# Patient Record
Sex: Female | Born: 1940 | Hispanic: No | Marital: Single | Smoking: Never smoker
Health system: Southern US, Community
[De-identification: ages and names within clinical notes are randomized; demographics above are authoritative.]

## PROBLEM LIST (undated history)

## (undated) DIAGNOSIS — E119 Type 2 diabetes mellitus without complications: Secondary | ICD-10-CM

## (undated) DIAGNOSIS — I10 Essential (primary) hypertension: Secondary | ICD-10-CM

## (undated) HISTORY — PX: APPENDECTOMY: SHX54

---

## 2020-07-13 ENCOUNTER — Other Ambulatory Visit: Payer: Self-pay

## 2020-07-13 ENCOUNTER — Ambulatory Visit (INDEPENDENT_AMBULATORY_CARE_PROVIDER_SITE_OTHER): Payer: Self-pay

## 2020-07-13 ENCOUNTER — Ambulatory Visit (HOSPITAL_COMMUNITY)
Admission: EM | Admit: 2020-07-13 | Discharge: 2020-07-13 | Disposition: A | Discharge status: Home / Self Care | Payer: Self-pay

## 2020-07-13 ENCOUNTER — Encounter (HOSPITAL_COMMUNITY): Payer: Self-pay

## 2020-07-13 DIAGNOSIS — S64493A Injury of digital nerve of left middle finger, initial encounter: Secondary | ICD-10-CM

## 2020-07-13 DIAGNOSIS — M79645 Pain in left finger(s): Secondary | ICD-10-CM

## 2020-07-13 DIAGNOSIS — S61213A Laceration without foreign body of left middle finger without damage to nail, initial encounter: Secondary | ICD-10-CM

## 2020-07-13 HISTORY — DX: Essential (primary) hypertension: I10

## 2020-07-13 HISTORY — DX: Type 2 diabetes mellitus without complications: E11.9

## 2020-07-13 MED ORDER — TETANUS-DIPHTH-ACELL PERTUSSIS 5-2.5-18.5 LF-MCG/0.5 IM SUSY
0.5000 mL | PREFILLED_SYRINGE | Freq: Once | INTRAMUSCULAR | Status: AC
  Administered 2020-07-13: 0.5 mL via INTRAMUSCULAR

## 2020-07-13 MED ORDER — DOXYCYCLINE HYCLATE 100 MG PO CAPS: 100.0000 mg | ORAL_CAPSULE | Freq: Two times a day (BID) | ORAL | 0 refills | Status: AC

## 2020-07-13 MED ORDER — BACITRACIN ZINC 500 UNIT/GM EX OINT
TOPICAL_OINTMENT | CUTANEOUS | Status: AC
  Filled 2020-07-13: qty 0.9

## 2020-07-13 MED ORDER — TETANUS-DIPHTH-ACELL PERTUSSIS 5-2.5-18.5 LF-MCG/0.5 IM SUSY
PREFILLED_SYRINGE | INTRAMUSCULAR | Status: AC
  Filled 2020-07-13: qty 0.5

## 2020-07-13 NOTE — ED Provider Notes (Addendum)
[facility]    CSN: [id number] Arrival date & time: 07/13/20  1615      History   Chief Complaint Chief Complaint  Patient presents with  . Finger Injury    HPI Rebecca Hutchinson is a 80 y.o. female. With her grand daughter, her daughter via phone and Thyra Breed our medical interpretor.   HPI  Finger Injury: Pt reports that last night she accidentally got her left middle finger caught in a door window. This caused immediate severe pain and bleeding which resolved within 5 minutes. Since then she has had a constant burning like sensation of this finger. They have cleaned and wrapped the wound. She denied fever, stiffness of the finger or discharge.    Past Medical History:  Diagnosis Date  . Diabetes mellitus without complication (HCC)   . Hypertension     There are no problems to display for this patient.   History reviewed. No pertinent surgical history.  OB History   No obstetric history on file.      Home Medications    Prior to Admission medications   Medication Sig Start Date End Date Taking? Authorizing Provider  docusate sodium (COLACE) 100 MG capsule Take 100 mg by mouth 2 (two) times daily.   Yes [provider]  omeprazole (PRILOSEC) 40 MG capsule Take 40 mg by mouth daily.   Yes [provider]    Family History History reviewed. No pertinent family history.  Social History Social History   Tobacco Use  . Smoking status: Never Smoker  . Smokeless tobacco: Never Used     Allergies   Patient has no known allergies.   Review of Systems Review of Systems  As stated above in HPI Physical Exam Triage Vital Signs ED Triage Vitals [07/13/20 1656]  Enc Vitals Group     BP (!) 149/77     Pulse Rate (!) 59     Resp 17     Temp 98.6 F (37 C)     Temp Source Oral     SpO2 100 %     Weight      Height      Head Circumference      Peak Flow      Pain Score      Pain Loc      Pain Edu?      Excl. in GC?    No  data found.  Updated Vital Signs BP (!) 149/77   Pulse (!) 59   Temp 98.6 F (37 C) (Oral)   Resp 17   SpO2 100%   Physical Exam Vitals and nursing note reviewed.  Cardiovascular:     Pulses: Normal pulses.  Musculoskeletal:        General: Swelling, tenderness and signs of injury present. Normal range of motion.  Skin:    Capillary Refill: Capillary refill takes less than 2 seconds.     Comments: 1cm superficial laceration of the medial left distal middle finger without erythema, discharge or bleeding   Neurological:     General: No focal deficit present.      UC Treatments / Results  Labs (all labs ordered are listed, but only abnormal results are displayed) Labs Reviewed - No data to display  EKG   Radiology No results found.  Procedures Procedures (including critical care time)  Medications Ordered in UC Medications - No data to display  Initial Impression / Assessment and Plan / UC Course  I have reviewed the  triage vital signs and the nursing notes.  Pertinent labs & imaging results that were available during my care of the patient were reviewed by me and considered in my medical decision making (see chart for details).     New. No fracture seen on x ray. Treating with wound care, tylenol for pain, Tdap as she is not sure when her last was and doxycyline given her history of DM2 and finger laceration to prevent infection. Discussed how to use these medications along with common potential side effects and precautions.  Final Clinical Impressions(s) / UC Diagnoses   Final diagnoses:  None   Discharge Instructions   None    ED Prescriptions    None     PDMP not reviewed this encounter.   Rushie Chestnut, PA-C 07/13/20 1829    Rushie Chestnut, PA-C 07/13/20 1829

## 2020-07-13 NOTE — ED Triage Notes (Signed)
Pt in with c/o left middle finger injury that occurred last night after she closed her finger in a door  Pt states she feels a burning sensation in her finger

## 2022-01-24 ENCOUNTER — Encounter (HOSPITAL_COMMUNITY): Payer: Self-pay | Admitting: Emergency Medicine

## 2022-01-24 ENCOUNTER — Ambulatory Visit (HOSPITAL_COMMUNITY)
Admission: EM | Admit: 2022-01-24 | Discharge: 2022-01-24 | Disposition: A | Discharge status: Home / Self Care | Payer: Self-pay | Attending: Family Medicine | Admitting: Family Medicine

## 2022-01-24 DIAGNOSIS — N898 Other specified noninflammatory disorders of vagina: Secondary | ICD-10-CM

## 2022-01-24 DIAGNOSIS — N3000 Acute cystitis without hematuria: Secondary | ICD-10-CM

## 2022-01-24 LAB — POCT URINALYSIS DIPSTICK, ED / UC
Bilirubin Urine: NEGATIVE
Glucose, UA: NEGATIVE mg/dL
Ketones, ur: NEGATIVE mg/dL
Nitrite: POSITIVE — AB
Protein, ur: 30 mg/dL — AB
Specific Gravity, Urine: 1.015 (ref 1.005–1.030)
Urobilinogen, UA: 0.2 mg/dL (ref 0.0–1.0)
pH: 5.5 (ref 5.0–8.0)

## 2022-01-24 LAB — CBG MONITORING, ED: Glucose-Capillary: 147 mg/dL — ABNORMAL HIGH (ref 70–99)

## 2022-01-24 MED ORDER — FLUCONAZOLE 150 MG PO TABS: 150.0000 mg | ORAL_TABLET | ORAL | 0 refills | Status: AC

## 2022-01-24 MED ORDER — CEPHALEXIN 250 MG/5ML PO SUSR: 250.0000 mg | Freq: Two times a day (BID) | ORAL | 0 refills | Status: AC

## 2022-01-24 MED ORDER — CLOTRIMAZOLE 1 % EX CREA: TOPICAL_CREAM | CUTANEOUS | 0 refills | Status: AC

## 2022-01-24 NOTE — ED Triage Notes (Signed)
Pt having vaginal itching for about week. Pt was scratching so much last night caused to bleed per family member.

## 2022-01-24 NOTE — ED Provider Notes (Signed)
[facility]    CSN: [id number] Arrival date & time: 01/24/22  1220      History   Chief Complaint Chief Complaint  Patient presents with   Vaginal Itching    HPI Rebecca Hutchinson is a 81 y.o. female.   Patient presents urgent care with her daughter-in-law for evaluation of vaginal itching for the last week.  Daughter-in-law states that patient has continued to itch the vaginal area to the point where she is bleeding intermittently from the vaginal tissue due to significant itch.  No recent change in personal hygiene products, laundry detergents, or medications.  Patient is a diabetic and daughter-in-law checks her blood sugar nightly then administers a sliding scale amount of insulin depending on blood sugar.  Lately, blood sugars have been in the 140s, but last night blood sugar was 248.  Patient denies changes in mental status, confusion, blurry vision, shortness of breath, abdominal pain, vaginal discharge, and abnormal uterine bleeding.  Complains of dysuria both during urination and pain after urination likely due to rash to the vaginal area.  Reports slight urinary frequency and urgency over the last couple of days as well.  Denies recent antibiotic or steroid use.  Patient has been visiting the [place] from [place] for the last 6 months and returns to [place] 4 days from now.  Daughter in law states that whenever patient comes to the [place] initially, her sugars are very out-of-control but they are able to get them down and into better control while she is here.  Patient frequently asks for Coca-Cola and sweets but daughter encourages her to drink lots of water.  No known medical history, although daughter-in-law states she was diagnosed with kidney problems when she received a scan of unknown type in [place] last year.  A language interpreter was used (Spanish medical interpreter used.).  Vaginal Itching    Past Medical History:  Diagnosis Date   Diabetes mellitus without  complication (HCC)    Hypertension     There are no problems to display for this patient.   Past Surgical History:  Procedure Laterality Date   APPENDECTOMY      OB History   No obstetric history on file.      Home Medications    Prior to Admission medications   Medication Sig Start Date End Date Taking? Authorizing Provider  cephALEXin (KEFLEX) 250 MG/5ML suspension Take 5 mLs (250 mg total) by mouth 2 (two) times daily for 7 days. 01/24/22 01/31/22 Yes StanhopeDonavan Burnet, FNP  clotrimazole (LOTRIMIN) 1 % cream Apply to affected area 2 times daily 01/24/22  Yes Stormy Sabol, Donavan Burnet, FNP  fluconazole (DIFLUCAN) 150 MG tablet Take 1 tablet (150 mg total) by mouth every 3 (three) days. 01/24/22  Yes Carlisle Beers, FNP  insulin aspart (NOVOLOG) 100 UNIT/ML injection Inject into the skin 3 (three) times daily before meals.   Yes [provider]  docusate sodium (COLACE) 100 MG capsule Take 100 mg by mouth 2 (two) times daily.    [provider]  doxycycline (VIBRAMYCIN) 100 MG capsule Take 1 capsule (100 mg total) by mouth 2 (two) times daily. 07/13/20   Rushie Chestnut, PA-C  omeprazole (PRILOSEC) 40 MG capsule Take 40 mg by mouth daily.    [provider]    Family History No family history on file.  Social History Social History   Tobacco Use   Smoking status: Never   Smokeless tobacco: Never     Allergies  Patient has no known allergies.   Review of Systems Review of Systems Per HPI  Physical Exam Triage Vital Signs ED Triage Vitals [01/24/22 1317]  Enc Vitals Group     BP 129/66     Pulse Rate 63     Resp 19     Temp 98.5 F (36.9 C)     Temp Source Oral     SpO2 98 %     Weight      Height      Head Circumference      Peak Flow      Pain Score      Pain Loc      Pain Edu?      Excl. in GC?    No data found.  Updated Vital Signs BP 129/66 (BP Location: Right Arm)   Pulse 63   Temp 98.5 F (36.9 C)  (Oral)   Resp 19   SpO2 98%   Visual Acuity Right Eye Distance:   Left Eye Distance:   Bilateral Distance:    Right Eye Near:   Left Eye Near:    Bilateral Near:     Physical Exam Vitals and nursing note reviewed.  Constitutional:      Appearance: She is not ill-appearing or toxic-appearing.  HENT:     Head: Normocephalic and atraumatic.     Right Ear: Hearing and external ear normal.     Left Ear: Hearing and external ear normal.     Nose: Nose normal.     Mouth/Throat:     Lips: Pink.     Mouth: Mucous membranes are moist.     Pharynx: No posterior oropharyngeal erythema.  Eyes:     General: Lids are normal. Vision grossly intact. Gaze aligned appropriately.     Extraocular Movements: Extraocular movements intact.     Conjunctiva/sclera: Conjunctivae normal.  Cardiovascular:     Rate and Rhythm: Normal rate and regular rhythm.     Heart sounds: Normal heart sounds, S1 normal and S2 normal.  Pulmonary:     Effort: Pulmonary effort is normal. No respiratory distress.     Breath sounds: Normal breath sounds and air entry.  Abdominal:     General: Abdomen is flat. Bowel sounds are normal.     Palpations: Abdomen is soft.     Tenderness: There is no abdominal tenderness. There is no right CVA tenderness or left CVA tenderness.  Genitourinary:    Comments: Dry appearing erythematous rash to groin folds, suprapubic area, labia majora, and labia minora with evidence of excoriation due to intense pruritus and some areas near the labia majora and labia minora.  Scant thick white vaginal discharge present to the vaginal introitus. Chaperone: Lyla Sonarrie, RN Musculoskeletal:     Cervical back: Neck supple.  Skin:    General: Skin is warm and dry.     Capillary Refill: Capillary refill takes less than 2 seconds.     Findings: No rash.  Neurological:     General: No focal deficit present.     Mental Status: She is alert and oriented to person, place, and time. Mental status is at  baseline.     Cranial Nerves: No dysarthria or facial asymmetry.  Psychiatric:        Mood and Affect: Mood normal.        Speech: Speech normal.        Behavior: Behavior normal.        Thought Content: Thought content normal.  Judgment: Judgment normal.      UC Treatments / Results  Labs (all labs ordered are listed, but only abnormal results are displayed) Labs Reviewed  POCT URINALYSIS DIPSTICK, ED / UC - Abnormal; Notable for the following components:      Result Value   Hgb urine dipstick TRACE (*)    Protein, ur 30 (*)    Nitrite POSITIVE (*)    Leukocytes,Ua MODERATE (*)    All other components within normal limits  CBG MONITORING, ED - Abnormal; Notable for the following components:   Glucose-Capillary 147 (*)    All other components within normal limits    EKG   Radiology No results found.  Procedures Procedures (including critical care time)  Medications Ordered in UC Medications - No data to display  Initial Impression / Assessment and Plan / UC Course  I have reviewed the triage vital signs and the nursing notes.  Pertinent labs & imaging results that were available during my care of the patient were reviewed by me and considered in my medical decision making (see chart for details).   1.  Vaginal itching Diflucan 150 mg today, then again in 3 days sent to pharmacy.  Patient to apply clotrimazole cream to the vaginal area twice daily for the next 7 days to treat vaginal yeast infection.  Advised patient to avoid itching the area as this will make symptoms worse and may cause a secondary bacterial infection if she is not careful.  Frequent vaginal yeast infections likely related to uncontrolled diabetes.  CBG 147 in clinic.  Encourage patient to watch her diet and follow-up with her primary care doctor when she gets back to [place].  2.  Acute cystitis without hematuria Urinalysis shows nitrite positive urinary tract infection with leukocytes and  hematuria.  I do not have access to any blood work or known past medical history other than hypertension and diabetes, therefore we will treat with renal dosing of Keflex based on daughter-in-law's report of kidney problems and patient's age.  She is to take 250 mg of Keflex twice daily for the next 7 days.  We will defer urine culture as patient will be in [place] in 4 days.  She is to follow-up with PCP in [place] as discussed above.  She is agreeable with this plan.   Discussed physical exam and available lab work findings in clinic with patient.  Counseled patient regarding appropriate use of medications and potential side effects for all medications recommended or prescribed today. Discussed red flag signs and symptoms of worsening condition,when to call the PCP office, return to urgent care, and when to seek higher level of care in the emergency department. Patient verbalizes understanding and agreement with plan. All questions answered. Patient discharged in stable condition.   Final Clinical Impressions(s) / UC Diagnoses   Final diagnoses:  Vaginal itching  Acute cystitis without hematuria     Discharge Instructions      Take Diflucan 1 tablet today, then another tablet in 3 days to treat vaginal yeast infection and help with itching.   Apply clotrimazole cream to the vaginal area 2 times a day for the next 7 days to treat vaginal yeast infection and help with itching.  Take Keflex 250 mg every 12 hours for the next 7 days to treat urinary tract infection.   If you develop any new or worsening symptoms or do not improve in the next 2 to 3 days, please return.  If your symptoms are severe,  please go to the emergency room.  Follow-up with your primary care provider for further evaluation and management of your symptoms as well as ongoing wellness visits.  I hope you feel better!     ED Prescriptions     Medication Sig Dispense Auth. Provider   cephALEXin (KEFLEX) 250 MG/5ML  suspension Take 5 mLs (250 mg total) by mouth 2 (two) times daily for 7 days. 70 mL Reita May M, FNP   clotrimazole (LOTRIMIN) 1 % cream Apply to affected area 2 times daily 15 g Reita May M, FNP   fluconazole (DIFLUCAN) 150 MG tablet Take 1 tablet (150 mg total) by mouth every 3 (three) days. 2 tablet Carlisle Beers, FNP      PDMP not reviewed this encounter.   Carlisle Beers, [place] 01/24/22 1535

## 2022-01-24 NOTE — Discharge Instructions (Addendum)
Take Diflucan 1 tablet today, then another tablet in 3 days to treat vaginal yeast infection and help with itching.   Apply clotrimazole cream to the vaginal area 2 times a day for the next 7 days to treat vaginal yeast infection and help with itching.  Take Keflex 250 mg every 12 hours for the next 7 days to treat urinary tract infection.   If you develop any new or worsening symptoms or do not improve in the next 2 to 3 days, please return.  If your symptoms are severe, please go to the emergency room.  Follow-up with your primary care provider for further evaluation and management of your symptoms as well as ongoing wellness visits.  I hope you feel better!

## 2022-02-03 IMAGING — DX DG FINGER MIDDLE 2+V*L*
3 series · 3 of 3 positions shown · non-contrast
Comparison: None.

CLINICAL DATA: Finger injury

EXAM:
LEFT MIDDLE FINGER 2+V

[finger ap]
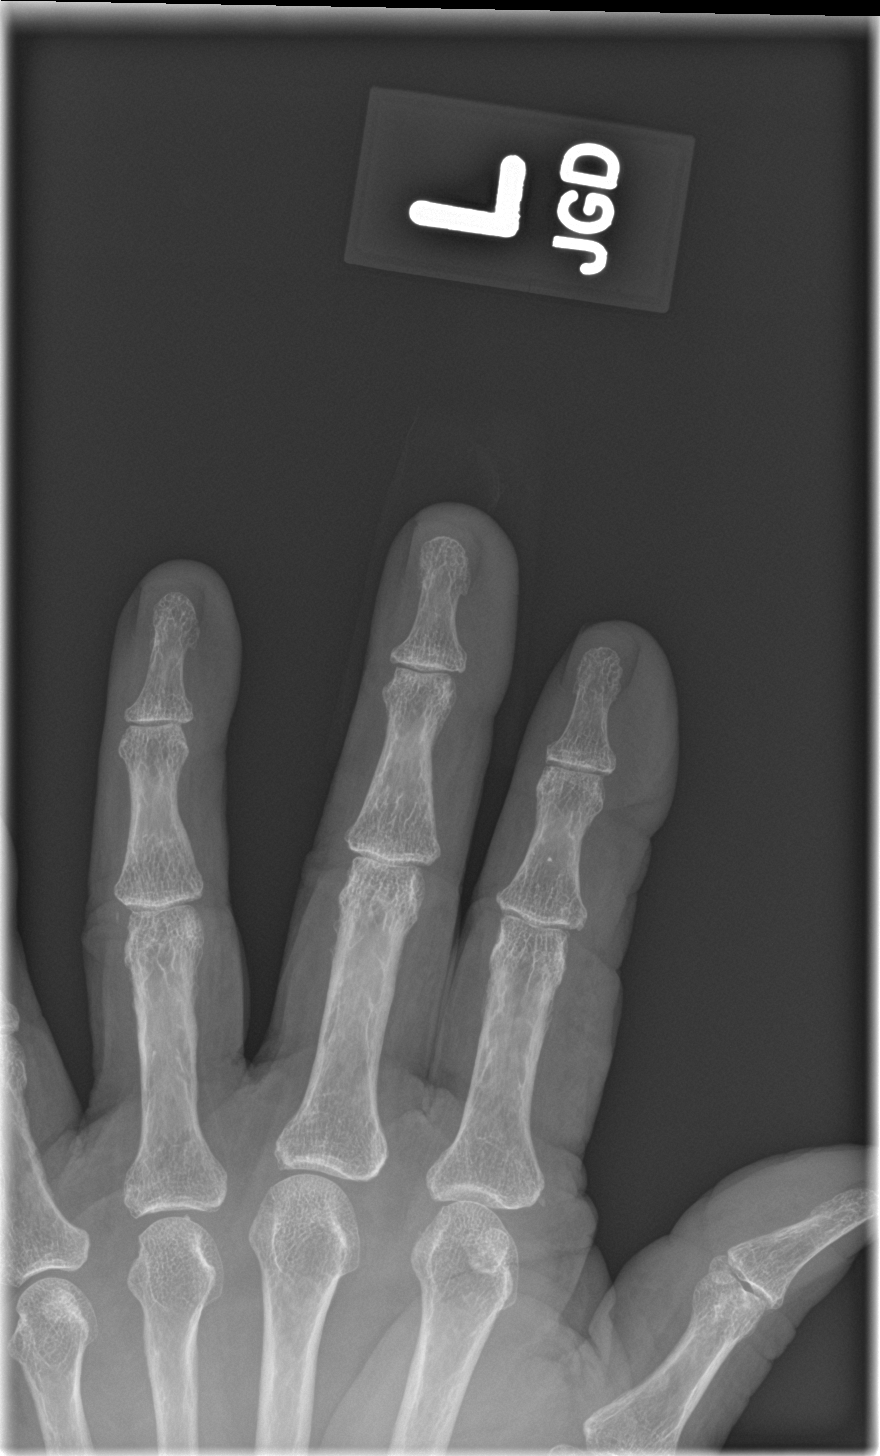

[finger obl]
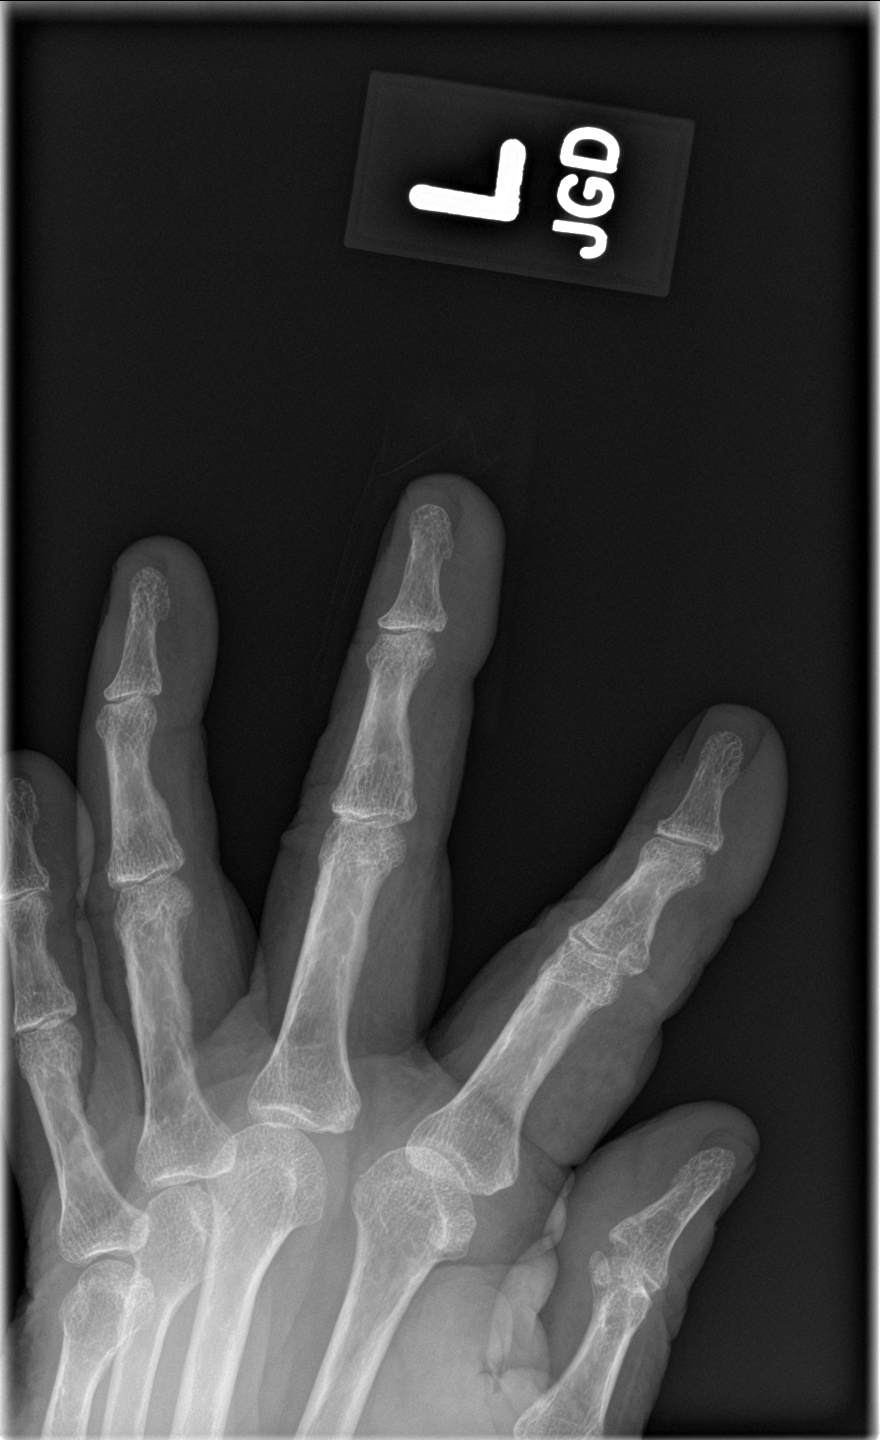

[finger lat]
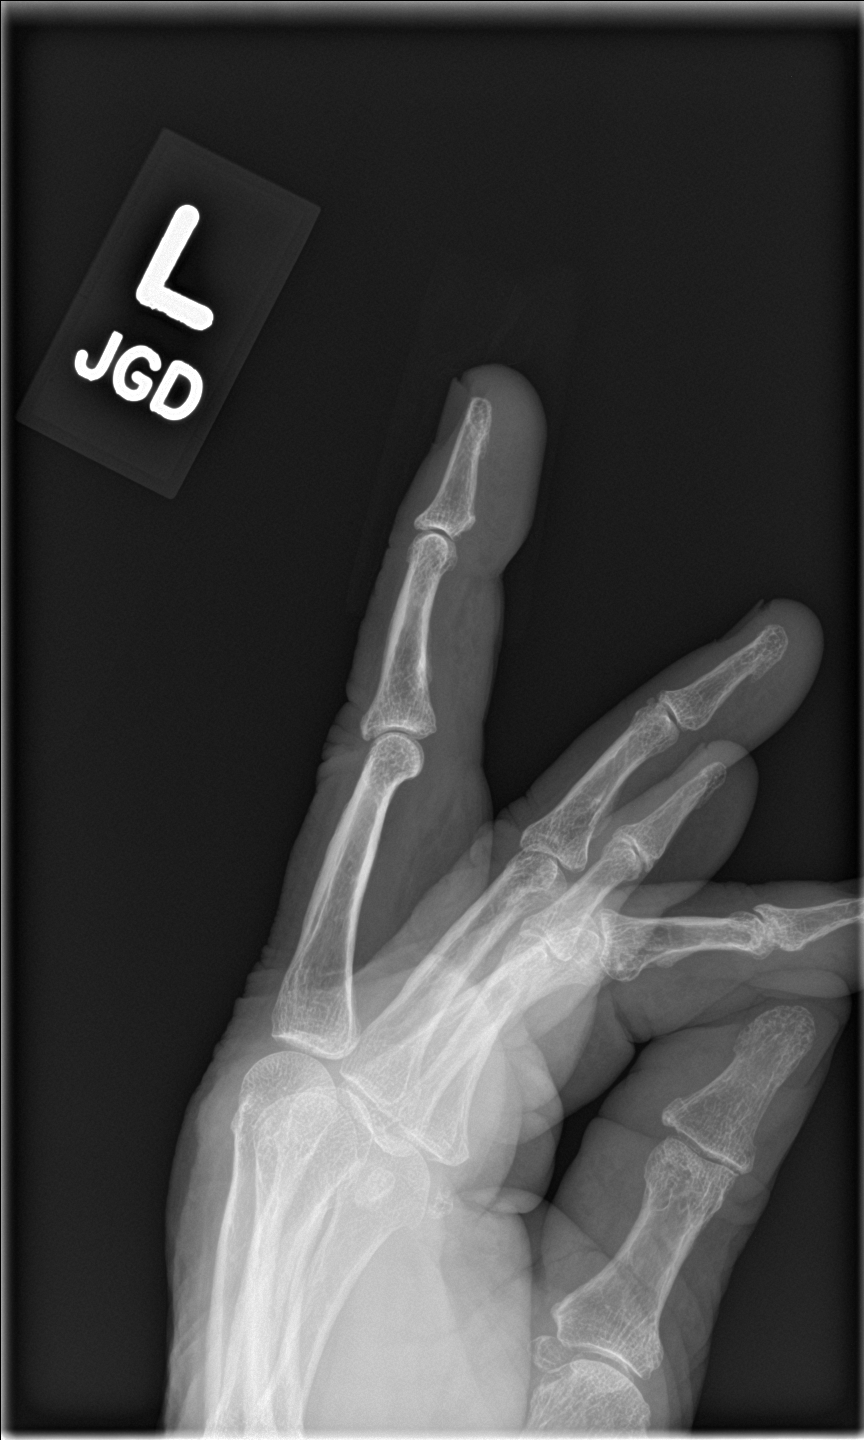

[3 of 3 positions shown; findings below may reference images not displayed]

FINDINGS: There is no evidence of fracture or dislocation. There is no
evidence of arthropathy or other focal bone abnormality. Soft
tissues are unremarkable.
IMPRESSION: Negative.
